# Patient Record
Sex: Male | Born: 1960 | Race: White | Hispanic: No | Smoking: Never smoker
Health system: Southern US, Community
[De-identification: ages and names within clinical notes are randomized; demographics above are authoritative.]

## PROBLEM LIST (undated history)

## (undated) DIAGNOSIS — C801 Malignant (primary) neoplasm, unspecified: Secondary | ICD-10-CM

## (undated) DIAGNOSIS — I10 Essential (primary) hypertension: Secondary | ICD-10-CM

## (undated) HISTORY — PX: KNEE ARTHROSCOPY: SUR90

## (undated) HISTORY — PX: BACK SURGERY: SHX140

## (undated) HISTORY — PX: COLONOSCOPY: SHX174

## (undated) HISTORY — PX: OTHER SURGICAL HISTORY: SHX169

## (undated) HISTORY — PX: ANKLE SURGERY: SHX546

---

## 1989-12-22 DIAGNOSIS — C801 Malignant (primary) neoplasm, unspecified: Secondary | ICD-10-CM

## 1989-12-22 HISTORY — DX: Malignant (primary) neoplasm, unspecified: C80.1

## 1998-11-22 ENCOUNTER — Encounter: Payer: Self-pay | Admitting: Neurosurgery

## 1998-11-22 ENCOUNTER — Ambulatory Visit (HOSPITAL_COMMUNITY): Admission: RE | Admit: 1998-11-22 | Discharge: 1998-11-22 | Payer: Self-pay | Admitting: Neurosurgery

## 1998-12-26 ENCOUNTER — Encounter: Payer: Self-pay | Admitting: Neurosurgery

## 1998-12-28 ENCOUNTER — Inpatient Hospital Stay (HOSPITAL_COMMUNITY): Admission: RE | Admit: 1998-12-28 | Discharge: 1998-12-30 | Payer: Self-pay | Admitting: Neurosurgery

## 1998-12-28 ENCOUNTER — Encounter: Payer: Self-pay | Admitting: Neurosurgery

## 1999-07-01 ENCOUNTER — Encounter: Admission: RE | Admit: 1999-07-01 | Discharge: 1999-07-22 | Payer: Self-pay | Admitting: Internal Medicine

## 1999-10-09 ENCOUNTER — Ambulatory Visit (HOSPITAL_COMMUNITY): Admission: RE | Admit: 1999-10-09 | Discharge: 1999-10-09 | Payer: Self-pay | Admitting: Neurosurgery

## 1999-10-09 ENCOUNTER — Encounter: Payer: Self-pay | Admitting: Neurosurgery

## 2001-05-05 ENCOUNTER — Ambulatory Visit (HOSPITAL_COMMUNITY): Admission: RE | Admit: 2001-05-05 | Discharge: 2001-05-05 | Payer: Self-pay | Admitting: Neurosurgery

## 2001-05-05 ENCOUNTER — Encounter: Payer: Self-pay | Admitting: Neurosurgery

## 2001-08-09 ENCOUNTER — Ambulatory Visit (HOSPITAL_COMMUNITY): Admission: RE | Admit: 2001-08-09 | Discharge: 2001-08-09 | Payer: Self-pay | Admitting: Internal Medicine

## 2002-06-28 ENCOUNTER — Inpatient Hospital Stay (HOSPITAL_COMMUNITY): Admission: RE | Admit: 2002-06-28 | Discharge: 2002-06-30 | Payer: Self-pay | Admitting: Neurosurgery

## 2002-06-28 ENCOUNTER — Encounter: Payer: Self-pay | Admitting: Neurosurgery

## 2002-07-05 ENCOUNTER — Observation Stay (HOSPITAL_COMMUNITY): Admission: RE | Admit: 2002-07-05 | Discharge: 2002-07-06 | Payer: Self-pay | Admitting: Neurosurgery

## 2002-08-19 ENCOUNTER — Ambulatory Visit (HOSPITAL_COMMUNITY): Admission: RE | Admit: 2002-08-19 | Discharge: 2002-08-19 | Payer: Self-pay | Admitting: Neurosurgery

## 2002-08-31 ENCOUNTER — Inpatient Hospital Stay (HOSPITAL_COMMUNITY): Admission: RE | Admit: 2002-08-31 | Discharge: 2002-09-03 | Payer: Self-pay | Admitting: Neurosurgery

## 2002-08-31 ENCOUNTER — Encounter: Payer: Self-pay | Admitting: Neurosurgery

## 2003-12-28 ENCOUNTER — Ambulatory Visit (HOSPITAL_COMMUNITY): Admission: AD | Admit: 2003-12-28 | Discharge: 2003-12-30 | Payer: Self-pay | Admitting: Neurosurgery

## 2006-01-13 ENCOUNTER — Ambulatory Visit (HOSPITAL_COMMUNITY): Admission: RE | Admit: 2006-01-13 | Discharge: 2006-01-13 | Payer: Self-pay | Admitting: Neurosurgery

## 2006-04-22 ENCOUNTER — Encounter: Payer: Self-pay | Admitting: Neurosurgery

## 2007-06-09 ENCOUNTER — Ambulatory Visit (HOSPITAL_BASED_OUTPATIENT_CLINIC_OR_DEPARTMENT_OTHER): Admission: RE | Admit: 2007-06-09 | Discharge: 2007-06-10 | Payer: Self-pay | Admitting: Orthopedic Surgery

## 2008-09-21 ENCOUNTER — Emergency Department: Payer: Self-pay | Admitting: Emergency Medicine

## 2008-09-27 ENCOUNTER — Encounter: Admission: RE | Admit: 2008-09-27 | Discharge: 2008-09-27 | Payer: Self-pay | Admitting: Neurosurgery

## 2008-12-22 HISTORY — PX: ROTATOR CUFF REPAIR: SHX139

## 2010-04-17 ENCOUNTER — Encounter: Admission: RE | Admit: 2010-04-17 | Discharge: 2010-04-17 | Payer: Self-pay | Admitting: Neurosurgery

## 2011-05-06 NOTE — Op Note (Signed)
Maurice Dennis, Maurice Dennis               ACCOUNT NO.:  [id number]   MEDICAL RECORD NO.:  [id number]          PATIENT TYPE:  AMB   LOCATION:  DSC                          FACILITY:  [facility]   PHYSICIAN:  Loreta Ave, M.D. DATE OF BIRTH:  11/20/1961   DATE OF PROCEDURE:  06/09/2007  DATE OF DISCHARGE:                               OPERATIVE REPORT   PREOPERATIVE DIAGNOSIS:  Right shoulder impingement, rotator cuff tear,  biceps tendinitis, acromioclavicular arthritis.   POSTOPERATIVE DIAGNOSIS:  Right shoulder impingement, rotator cuff tear,  biceps tendinitis, acromioclavicular arthritis, with full thickness cuff  tear as well as posterior, superior, and anterior labral tears,  irreparable.   OPERATIVE PROCEDURE:  Right shoulder exam under anesthesia, arthroscopy,  debridement of rotator cuff and labrum, acromioplasty, coracoacromial CA  ligament release, excision of distal clavicle, mini-open repair of his  cuff tear with FiberWire suture and Concept repair system.   SURGEON:  Loreta Ave, M.D.   ASSISTANT:  Genene Churn. Barry Dienes, P.A.-C., present throughout the entire case.   ANESTHESIA:  General.   BLOOD LOSS:  Minimal.   SPECIMENS:  None.   COMPLICATIONS:  None.   DRESSINGS:  Soft compressive with shoulder immobilizer.   PROCEDURE:  The patient was brought to the operating room and placed on  the operating table in a supine position.  After adequate anesthesia was  obtained, the right shoulder was examined.  Full motion and good  stability.  Placed in a beach chair position in a shoulder positioner,  prepped and draped in the usual sterile fashion.  Three portals,  anterior, posterior, lateral.  Shoulder entered with a blunt obturator.  Arthroscope was introduced, shoulder distended and inspected.  Complex  tearing labrum the entire top half all debrided.  Biceps tendon biceps  anchor still intact functional after debridement.  Articular cartilage  looked good throughout.   Full thickness tear rotator cuff supraspinatus  tendon and most of crescent region.  Biceps tendon had some changes but  was structurally intact.  Cuff debrided.  Cannula redirected  subacromially.  Impingement from grade 4 changes and osteolysis of the  AC joint.  Also type 2 acromion.  Bursa resected. Cuff debrided.  CA  ligament released.  Acromioplasty to a type 1 acromion with a shaver and  high speed bur.  Distal clavicle exposed.  CA ligament had already been  released with cautery.  Lateral cm of clavicle and periarticular spurs  removed with shaver and high speed bur.  Adequacy of decompression and  clavicle excision confirmed viewing from all portals.  Instruments and  fluid removed.  Deltoid splitting incision through the lateral portal.  Subacromial space accessed.  Adequacy of decompression confirmed.  Cuff  was then mobilized and debrided back to healthy tissue and captured with  weave FiberWire suture.  Tuberosity roughened to bleeding bone.  Concept  repair system used to channel the sutures through numerous drill holes.  Arm was then abducted, sutures tied over a bony bridge.  This yielded a  nice firm watertight closure of the cuff with full passive motion.  Wound irrigated.  Deltoid  closed with Vicryl.  Skin and subcutaneous  tissue with Vicryl.  Portals closed with nylon.  Margins of the wound  and subacromial space all injected with Marcaine.  Sterile compressive  dressing applied.  Shoulder immobilizer applied.  Anesthesia reversed.  Brought to the recovery room.  Tolerated surgery well.  No  complications.      Loreta Ave, M.D.  Electronically Signed     DFM/MEDQ  D:  06/09/2007  T:  06/09/2007  Job:  [id number]

## 2011-05-09 NOTE — H&P (Signed)
NAME:  Maurice Dennis, Maurice Dennis                         ACCOUNT NO.:  [id number]   MEDICAL RECORD NO.:  [id number]                   PATIENT TYPE:  OIB   LOCATION:  2899                                 FACILITY:  [facility]   PHYSICIAN:  Payton Doughty, M.D.                   DATE OF BIRTH:  1961-12-13   DATE OF ADMISSION:  12/28/2003  DATE OF DISCHARGE:                                HISTORY & PHYSICAL   ADMISSION DIAGNOSIS:  Malposition of spinal cord stimulator.   HISTORY OF PRESENT ILLNESS:  This is a 50 year old right-handed white  gentleman with numerous spinal procedures.  Had a spinal cord stimulator  placed in July, 2003.  He has had revision of it.  It still does not seem to  be working right.  After careful inspection and discussion with numerous  people, it seems that the electrodes may be placed opposite the direction  they need to be facing for stimulation of the spinal cord.  He is now  admitted for revision.  He also has the receiver on his left hip, and he  wants it moved to this right hip.   PAST MEDICAL HISTORY:  1. Hypertension.  2. Basal cell carcinoma.  3. He had an appendectomy at 50 years of age.   MEDICATIONS:  1. Vicodin.  2. OxyContin.  3. Flexeril.  4. Neurontin.   ALLERGIES:  None.   SOCIAL HISTORY:  He does not smoke or drink.  He is on disability.   FAMILY HISTORY:  Mom died at 9 of lung cancer.  Dad died of unknown causes.   REVIEW OF SYSTEMS:  Noncontributory.  His bladder is functioning.  He does  have back and leg pain.   PHYSICAL EXAMINATION:  HEENT:  Normal.  He does have good range of motion of  the neck.  CHEST:  Clear.  CARDIAC:  Regular rate and rhythm.  ABDOMEN:  Nontender.  No hepatosplenomegaly.  EXTREMITIES:  Without clubbing or cyanosis.  Peripheral pulses are good.  GU:  Deferred.  NEUROLOGIC:  He is intact in regard to strength, sensation, and reflexes in  the upper and lower extremities.  Cranial nerves are functioning normally.  He  has a well-healed incision on his back.   CLINICAL IMPRESSION:  A probable malposition of spinal cord stimulator.  The  plan is for admission and revision.  The risks and benefits of this approach  have been discussed with him, and he wishes to proceed.                                                Payton Doughty, M.D.    MWR/MEDQ  D:  12/28/2003  T:  12/28/2003  Job:  [id number]

## 2011-05-09 NOTE — Op Note (Signed)
[place]. [facility]  Patient:    [name], [name] Visit Number: [id number] MRN: [id number]          Service Type: SUR Location: 3000 3011 [id number] Attending Physician:  Emeterio Reeve Dictated by:   Payton Doughty, M.D. Proc. Date: 06/28/02 Admit Date:  06/28/2002 Discharge Date: 06/30/2002                             Operative Report  PREOPERATIVE DIAGNOSIS:  Chronic intractable pain.  POSTOPERATIVE DIAGNOSIS:  Chronic intractable pain.  OPERATION PERFORMED:  T10 thoracic laminectomy for insertion of epidural spinal cord stimulating electrode array.  SURGEON:  Payton Doughty, M.D.  ANESTHESIA:  General endotracheal.  PREP:  Sterile Betadine prep and scrub with alcohol wipe.  COMPLICATIONS:  None.  ASSISTANT:  [place].  DESCRIPTION OF PROCEDURE:  The patient is a 50 year old gentleman with chronic intractable back and right leg pain.  The patient was taken to the operating room, smoothly anesthetized and intubated, and placed prone on the operating table.  Following shave, prep and drape in the usual sterile fashion the skin was infiltrated with 1% lidocaine, 1:400,000 epinephrine.  Skin was incised from the bottom of T11 to the bottom of T9.  The lamina of T10 and T11 were exposed bilaterally subperiosteal plane and a partial laminectomy at T10 was carried out to allow access to the posterior epidural space.  Ligamentum flavum was removed and the epidural space visualized.  The model [id number] electrode was placed in the epidural space without difficulty.  It was connected to the extension leads which were tunneled subcutaneously and exited on the right side over about the 12th rib.  The sleeves had been previously placed over the leads and they were anchored to the T11 lamina through a hole that was created with a towel clip.  It was anchored there with 3-0 silk.  The wound was irrigated and hemostasis assured.  The fascia was reapproximated with 0  Vicryl in interrupted fashion.  Subcutaneous tissues were reapproximated with 0 Vicryl in interrupted fashion.  Subcuticular tissues were reapproximated with 3-0 Vicryl in interrupted fashion.  The skin was closed with 3-0 nylon in a running locked fashion.  Bacitracin Telfa dressing was applied over the exit site for the leads.  An occlusive dressing was placed with bacitracin.  The patient then returned to the recovery room in good condition.Dictated by:   Payton Doughty, M.D. Attending Physician:  Emeterio Reeve DD:  06/28/02 TD:  07/01/02 Job: [id number]

## 2011-05-09 NOTE — Discharge Summary (Signed)
   NAME:  ANGELICA, WIX NO.:  [id number]   MEDICAL RECORD NO.:  [id number]                   PATIENT TYPE:  INP   LOCATION:                                       FACILITY:  [facility]   PHYSICIAN:  Payton Doughty, M.D.                   DATE OF BIRTH:  1961-01-09   DATE OF ADMISSION:  08/31/2002  DATE OF DISCHARGE:  09/03/2002                                 DISCHARGE SUMMARY   PROCEDURE:  Replacement of spinal cord stimulator.   COMPLICATIONS:  None.   DISCHARGE STATUS:  Doing well.   HOSPITAL COURSE:  This is a 50 year old gentleman who had a spinal cord  stimulator placed several weeks before. It worked for a while and then  stopped working, there for readjustment. He was admitted after ascertaining  normal laboratory values, taken to the operating room, and had the spinal  cord stimulator replaced and moved up. Postoperatively, he did well with  externalized leads. He was discharged home the next morning on ciprofloxacin  and pain medications. His followup will be in the [facility] office in a week for reimplantation of his receiver.                                               Payton Doughty, M.D.    MWR/MEDQ  D:  06/21/2003  T:  06/21/2003  Job:  [id number]

## 2011-05-09 NOTE — Op Note (Signed)
   NAME:  Maurice Dennis, PELCHER                         ACCOUNT NO.:  [id number]   MEDICAL RECORD NO.:  [id number]                   PATIENT TYPE:  INP   LOCATION:  NA                                   FACILITY:  [facility]   PHYSICIAN:  Payton Doughty, M.D.                   DATE OF BIRTH:  1961-04-18   DATE OF PROCEDURE:  08/19/2002  DATE OF DISCHARGE:                                 OPERATIVE REPORT   PREOPERATIVE DIAGNOSIS:  Malfunctioning spinal cord stimulator.   POSTOPERATIVE DIAGNOSIS:  Malfunctioning spinal cord stimulator.   OPERATION:  Exploration of spinal cord stimulator receiver.   SURGEON:  Payton Doughty, M.D.   COMPLICATIONS:  None.   ANESTHESIA:  Sedation and 1% local lidocaine.   ASSISTANT:  None.   PROCEDURE:  This is a 50 year old gentleman with spinal cord stimulator in  place.  It is not working appropriately.  Concern was over the receiver.  He  was taken to the operating room, placed in the right side down, left side up  position.  Sedation was given following shave, prep, and drape in the usual  sterile fashion.  Field block was done around the stimulator receiver site.  The site was opened and stimulator externalized.  The wire was removed and  by a sterile inner wire was connected to a programmed stimulator.  Electrical contact was confirmed throughout the system.  Physiologic  response occurred only on one electrode on the left side.  Consultation with  the engineer confirmed that the source of impedance was probably  physiologic.  The receiver was therefore reconnected and replaced.  The  incision was closed.  The patient returned to the recovery room in good  condition.                                               Payton Doughty, M.D.    MWR/MEDQ  D:  08/19/2002  T:  08/23/2002  Job:  [id number]

## 2011-05-09 NOTE — Op Note (Signed)
[place]. [facility]  Patient:    Maurice Dennis, Maurice Dennis Visit Number: [id number] MRN: [id number]          Service Type: DSU Location: 3000 3007 01 Attending Physician:  Emeterio Reeve Dictated by:   Payton Doughty, M.D. Proc. Date: 07/05/02 Admit Date:  07/05/2002 Discharge Date: 07/06/2002                             Operative Report  PREOPERATIVE DIAGNOSIS:  Intractable back pain, status post implantation of an epidural spinal cord stimulator.  POSTOPERATIVE DIAGNOSIS:  Intractable back pain, status post implantation of an epidural spinal cord stimulator.  PROCEDURE:  Connection of epidural stimulator to antenna source.  SURGEON:  Payton Doughty, M.D.  ANESTHESIA:  General endotracheal.  PREP:  Sterile prep with alcohol wipe.  COMPLICATIONS:  None.  ASSISTANT:  [place].  INDICATIONS FOR PROCEDURE:  This is a 50 year old gentleman who has had epidural leads implanted previously.  DESCRIPTION OF PROCEDURE:  He is taken to the operating room, intubated, placed prone on the operating room table.  Following shave, prep, and drape in the usual sterile fashion, the old skin incision was re-opened and the externalized leads identified, disconnected from the permanent leads, and removed from the skin on the right side.  A subcutaneous tunnel was created down to the left side to just below the posterior cephalic spine, and a subcutaneous tunnel created there which included only the skin.  It was less than 1 cm thick.  The antenna was tunneled from the old incision to the new incision.  The leads brought through the tunnel and connected to the antenna, secured there, and the strain relief garment was placed over top of it.  A loop was taken, and it was implanted with the loop underneath the antenna. Both wounds were copiously irrigated, hemostasis assured, and closed with successive layers of 3-0 Vicryl, and the skin with 3-0 nylon in a simple fashion.   Bacitracin and Telfa dressing was applied, and made occlusive with Op-Site.  The patient will return to the recovery room in good condition.Dictated by:   Payton Doughty, M.D. Attending Physician:  Emeterio Reeve DD:  07/05/02 TD:  07/08/02 Job: [id number]

## 2011-05-09 NOTE — Op Note (Signed)
   NAME:  Maurice Dennis, Maurice Dennis                         ACCOUNT NO.:  [id number]   MEDICAL RECORD NO.:  [id number]                   PATIENT TYPE:  INP   LOCATION:  3009                                 FACILITY:  [facility]   PHYSICIAN:  Payton Doughty, M.D.                   DATE OF BIRTH:  28-Feb-1961   DATE OF PROCEDURE:  09/02/2002  DATE OF DISCHARGE:                                 OPERATIVE REPORT   PREOPERATIVE DIAGNOSIS:  Externalized spinal cord stimulator.   POSTOPERATIVE DIAGNOSIS:  Externalized spinal cord stimulator.   PROCEDURE:  Internalization of spinal cord stimulator with connection to  subcutaneous receiver.   SURGEON:  Payton Doughty, M.D.   NURSE ASSISTANT:  [facility].   ANESTHESIA:  General endotracheal.   PREPARATION:  Sterile Betadine prep and scrub with alcohol wipe.   COMPLICATIONS:  None.   DESCRIPTION OF PROCEDURE:  This is a 50 year old right-handed white  gentleman with an externalized wire from his spinal cord stimulator.  He was  taken to the operating room and smoothly anesthetized and intubated, placed  prone upon the operating room table.  Following shave, prep, and drape in  the usual sterile fashion, his two old incisions were opened and the spinal  cord leads severed and withdrawn from the skin, the extension leads.  A  subcutaneous tunnel was then created to the receiver site.  The receiver was  exposed and the old leads removed.  The new leads were tunneled  subcutaneously to the receiver site, connected to the receivers with the  nipples in place.  Both wounds were copiously irrigated with bacitracin  solution.  The system was thus connected.  The receiver was placed back in  the receiver pocket.  All incisions were closed with successive layers of 0  Vicryl, 3-0 Vicryl, and 3-0 nylon in the skin.  A Bacitracin Telfa dressing  was applied, made occlusive with OpSite, and the patient returned to the  recovery room in good condition.                                 Payton Doughty, M.D.    MWR/MEDQ  D:  09/02/2002  T:  09/03/2002  Job:  [id number]

## 2011-05-09 NOTE — H&P (Signed)
[place]. [facility]  Patient:    Maurice Dennis, Maurice Dennis Visit Number: [id number] MRN: [id number]          Service Type: SUR Location: 3000 3011 01 Attending Physician:  Emeterio Reeve Dictated by:   Payton Doughty, M.D. Admit Date:  06/28/2002 Discharge Date: 06/30/2002                           History and Physical  ADMITTING DIAGNOSES:  Intractable back and right leg pain.  BRIEF HISTORY:  This is a 50 year old right handed white gentleman who has had decompressive laminectomies in 1997, lumbar fusion in 1997.  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  He has been visited with the pain clinic who feels that he is a good candidate for spinal cord stimulator and he is for placement.  PAST MEDICAL HISTORY:  Remarkable for hypertension and a basal cell carcinoma in 1984 and an appendectomy at 50 years of age.  MEDICATIONS: 1. Vicodin. 2. OxyContin. 3. Flexeril.  ALLERGIES:  None.  SOCIAL HISTORY:  He does not smoke or drink.  He is on disability.  FAMILY HISTORY:  Mom died at 68 of lung cancer.  Father died of unknown causes.  REVIEW OF SYSTEMS:  noncontributory for bladder dysfunction.  He does have back and leg pain.  PHYSICAL EXAMINATION:  HEENT:  Within normal limits.  NECK:  Good range of motion in the neck.  CHEST:  Clear.  CARDIAC:  Regular rate and rhythm.  ABDOMEN:  Nontender.  No hepatosplenomegaly.  EXTREMITIES:  Without clubbing or cyanosis.  GU:  Deferred.  PERIPHERAL PULSES:  Good.  NEUROLOGIC:  He is awake, alert and oriented.  His cranial nerves are intact.  MOTOR EXAMINATION: shows 5/5 strength throughout the upper and lower extremities with bilaterally positive straight leg raises.  Reflexes are 2 at the knees, absent at the ankles.  Toes are downgoing bilaterally.  As noted before he visited with the pain  clinic who thought he was a good candidate but not for percutaneous procedure.  He is therefore admitted for placement of a percutaneous paddle array from T10 to T8 for trialing with subsequent reimplantation ______ trial successful.  The risks and benefits of this approach have been discussed with him and he wished to proceed. Dictated by:   Payton Doughty, M.D. Attending Physician:  Emeterio Reeve DD:  06/28/02 TD:  06/30/02 Job: [id number]

## 2011-05-09 NOTE — Op Note (Signed)
Maurice Dennis, Maurice Dennis               ACCOUNT NO.:  [id number]   MEDICAL RECORD NO.:  [id number]          PATIENT TYPE:  AMB   LOCATION:  SDS                          FACILITY:  [facility]   PHYSICIAN:  Payton Doughty, M.D.      DATE OF BIRTH:  1961-11-19   DATE OF PROCEDURE:  01/13/2006  DATE OF DISCHARGE:  01/13/2006                                 OPERATIVE REPORT   PREOPERATIVE DIAGNOSIS:  Spinal cord stimulator with receiver in place.   POSTOPERATIVE DIAGNOSIS:  Spinal cord stimulator with receiver in place.   OPERATIVE PROCEDURE:  Changing receiver for rechargeable battery powered  pulse generator.   SURGEON:  Payton Doughty, M.D.   SERVICE:  Neurosurgery   ANESTHESIA:  General endotracheal anesthesia.   PREPARATION:  Betadine and alcohol wipe.   COMPLICATIONS:  None.   ASSISTANT:  [place]   BODY OF TEXT:  This is a 50 year old gentleman with spinal stimulator in  place, has a receiver and wanted to change it to a battery.  He is taken to  the operating room, smoothly anesthetized, intubated, placed prone on the  operating table.  Following shave, prep, and drape in the usual sterile  fashion, the old skin incision was opened over the receiver.  The receiver  was rapidly mobilized, wires detached and tested, the impedance was good.  The pocket was enlarged appropriately and the battery pack placed within it  after it had been connected to the wires.  Impedance testing was, once  again, satisfactory.  The wound was irrigated and hemostasis assured, and  the incision closed with successive layers of 0 Vicryl, 3-0 Vicryl, and 3-0  nylon.  Betadine and Telfa dressing was applied and made occlusive with  OpSite and the patient returned to the recovery room in good condition.           ______________________________  Payton Doughty, M.D.     MWR/MEDQ  D:  01/13/2006  T:  01/13/2006  Job:  [id number]

## 2011-05-09 NOTE — Op Note (Signed)
NAME:  Maurice Dennis, Maurice Dennis                         ACCOUNT NO.:  [id number]   MEDICAL RECORD NO.:  [id number]                   PATIENT TYPE:  INP   LOCATION:  3009                                 FACILITY:  [facility]   PHYSICIAN:  Payton Doughty, M.D.                   DATE OF BIRTH:  Feb 06, 1961   DATE OF PROCEDURE:  08/31/2002  DATE OF DISCHARGE:                                 OPERATIVE REPORT   PREOPERATIVE DIAGNOSIS:  Nonfunctioning spinal cord stimulator.   POSTOPERATIVE DIAGNOSIS:  Nonfunctioning spinal cord stimulator.   PROCEDURE:  Revision of spinal cord stimulator.   SURGEON:  Payton Doughty, M.D.   NURSE ASSISTANT:  [facility].   ANESTHESIA:  General endotracheal.   PREPARATION:  Sterile Betadine prep and scrub with alcohol wipe.   COMPLICATIONS:  None.   DESCRIPTION OF PROCEDURE:  This is a 50 year old gentleman with spinal cord  stimulator that was extending from T9 to T7, and it is not functioning.  Here for removing the leads.  The patient was taken to the operating room  and smoothly anesthetized and intubated, placed prone on the operating  table.  Following shave, prep, and drape in the usual sterile fashion, the  old skin incision in the thoracic spine was reopened.  Careful dissection  through the scar revealed the lamina of T10 and following the wires down  into the epidural space, the old electrodes were uncovered.  The right-sided  wire was noted to have loss of its insulated covering.  This cannot be said  whether this was present prior to operation or was caused intraoperatively.  The old electrode was dissected free and removed.  The bottom end of that  was at about the T9 space.  It was desirable to place the new electrode from  T9 through T11, so the partial laminectomy at T9 and removal of some scar as  well as dissection of the posterior epidural space was undertaken under T10.  The electrode was then slid under the lamina of T10, went smoothly without  difficulty.  Initial x-ray showed the electrode going from mid-T10 to mid-  T12.  Further laminotomy of T9 was undertaken and the electrode pulled back  so that it covered the area from the bottom of T9 to the bottom of T11.  It  was then attached to the extension wires, which exited on the right side  approximately 10 cm lateral to the skin incision.  The wound was irrigated  and hemostasis assured.  The electrodes have been secured to the lamina of  T9 by an 0 silk stitch.  The fascia was reapproximated with 0 Vicryl in  interrupted fashion, the subcutaneous tissue was reapproximated with 0  Vicryl in interrupted fashion, the subcuticular tissue was reapproximated  with 3-0 Vicryl in interrupted fashion.  The skin was closed with 3-0 nylon  in a running locked fashion.  Bacitracin dressing was applied over the  incision and over the wire exit site.  The patient then returned to the  recovery room in good condition.                                               Payton Doughty, M.D.   MWR/MEDQ  D:  08/31/2002  T:  08/31/2002  Job:  [id number]

## 2011-05-09 NOTE — H&P (Signed)
NAME:  Maurice Dennis, Maurice Dennis                         ACCOUNT NO.:  [id number]   MEDICAL RECORD NO.:  [id number]                   PATIENT TYPE:  INP   LOCATION:  3009                                 FACILITY:  [facility]   PHYSICIAN:  Payton Doughty, M.D.                   DATE OF BIRTH:  05-26-1961   DATE OF ADMISSION:  08/31/2002  DATE OF DISCHARGE:                                HISTORY & PHYSICAL   ADMITTING DIAGNOSIS:  Nonfunctioning spinal cord stimulator.   BODY OF TEXT:  This is a 50 year old, right-handed, white gentleman with an  extremely complicated back history.  He has had decompressive laminectomy in  1997, lumbar fusion in 1997, epidural __________  resection L3 and L4.  He  underwent implantation of spinal cord stimulator 06/28/2002.  The stimulator  bridged from T9 to T7.  Initially it worked well during trials.  After  implantation it has stopped working in the side where his leg is the worse,  which is the right side.  He has undergone exploration stimulator and  receiver is found to be working all right.  After consultation with doctors  in [place] and with the company, it is felt that the stimulator panels need  to be moved to a lower level.  He is now admitted for this revision.   PAST MEDICAL HISTORY:  Marked for hypertension, basil cell carcinoma in  1984, appendectomy 15 years ago.   MEDICATIONS:  Vicodin, OxyContin, and Flexeril.   ALLERGIES:  NONE.   SOCIAL HISTORY:  He does not smoke or drink.  He is on disability.   FAMILY HISTORY:  Mother died at 34 of lung cancer.  Father died of unknown  causes.   REVIEW OF SYSTEMS:  Noncontributory for bladder dysfunction.  He has back  and right leg pain.   PHYSICAL EXAMINATION:  HEENT:  Normal.  NECK:  He has good range of motion.  CHEST:  Normal.  CARDIOVASCULAR:  Regular rate and rhythm.  ABDOMEN:  Nontender with no hepatosplenomegaly.  EXTREMITIES:  No clubbing, cyanosis, or edema.  Peripheral pulses are good.  NEUROLOGICAL:  He is awake, alert, and oriented.  His cranial nerves are  intact.  His motor exam 5/5 strength throughout the upper and lower  extremities.  There is no current sensory deficit.  His incisions are well-  healed and his receiver site is well-healed.   PLAN:  Admission and revision.  Initially we tried use of same incision and  replaced his electrodes down in a retrograde fashion.  If that is not  feasible, it will have to be externalized and done as an open procedure from  T11 up to T9.  The received itself seems to be in good position.  If we can  do this without removing it, I think that would be the best option for this  gentleman.  Payton Doughty, M.D.   MWR/MEDQ  D:  08/31/2002  T:  08/31/2002  Job:  [id number]

## 2011-05-09 NOTE — H&P (Signed)
[facility]  Patient:    Maurice Dennis, Maurice Dennis Visit Number: [id number] MRN: [id number]          Service Type: DSU Location: 3000 [id number] Attending Physician:  Emeterio Reeve Dictated by:   Payton Doughty, M.D. Admit Date:  07/05/2002 Discharge Date: 07/06/2002                           History and Physical  INTERVAL ADMIT NOTE  HISTORY OF PRESENT ILLNESS:  This is a 50 year old right-handed gentleman who a week ago underwent implantation of an epidural stimulator via thoracic laminectomy.  Postoperatively, he has done well and trialing of the stimulator has been carried out this week and he has obtained significant pain relief and now he is admitted for placement of the internalized antenna.  PAST MEDICAL HISTORY:  Medical history is remarkable for intractable back pain; he has had several laminectomies and fusion.  MEDICATIONS:  He is on OxyContin, Neurontin, Celebrex and Vicodin.  PHYSICAL EXAMINATION:  GENERAL:  His general exam is intact.  NEUROLOGIC:  His neurologic exam is intact.  CHEST:  He has some drainage at the site of his externalized wires, which is anticipated.  PLAN:  The plan is for internalization of his stimulator and the risks and benefits have been discussed with him and he wishes to proceed. Dictated by:   Payton Doughty, M.D. Attending Physician:  Emeterio Reeve DD:  07/05/02 TD:  07/07/02 Job: [id number]

## 2011-05-09 NOTE — Op Note (Signed)
NAME:  Maurice Dennis, Maurice Dennis                         ACCOUNT NO.:  [id number]   MEDICAL RECORD NO.:  [id number]                   PATIENT TYPE:  OIB   LOCATION:  2899                                 FACILITY:  [facility]   PHYSICIAN:  Payton Doughty, M.D.                   DATE OF BIRTH:  July 11, 1961   DATE OF PROCEDURE:  12/28/2003  DATE OF DISCHARGE:                                 OPERATIVE REPORT   PREOPERATIVE DIAGNOSIS:  Malposition of spinal cord stimulator.   POSTOPERATIVE DIAGNOSIS:  Malposition of spinal cord stimulator.   OPERATION PERFORMED:  Revision of spinal cord stimulator.   SURGEON:  Payton Doughty, M.D.   ASSISTANTBasilia Jumbo.   ANESTHESIA:  General endotracheal.   PREP:  Sterile Betadine prep and scrub with alcohol wipe.   COMPLICATIONS:  None.   INDICATIONS FOR PROCEDURE:  This is a 50 year old gentleman with a spinal  cord stimulator placed that is probably in the wrong position.   DESCRIPTION OF PROCEDURE:  The patient was taken to the operating room,  smoothly anesthetized, intubated, and placed prone on the operating table.  Following shave, prep and drape in the usual sterile fashion, the skin  incision was opened to the T8-T9 level and the stimulator wires identified.  Dissection along the wires revealed the insertion point of the spinal cord  stimulating electrode which was removed and was found indeed to be  positioned such that the stimulating part of the electrode was away from the  cord.  It was replaced with a curved stimulating electrode in which they  reported it could not turn over and had to be inserted in the proper  direction.  Attention was turned to the left hip where the old receiver was  isolated by a skin incision and removed per the patient's request.  That  incision was closed.  Incision was then made on the right hip in mirror  image fashion.  Subcutaneous pocket created.  The stimulating wires were  then tunneled from the stimulator site to the  receiver site and connected to  the receiver.  Strain relief devices were placed and secured at both ends.  The electrode receiver was implanted under the subcutaneous pocket with good  fit.  Both wounds were irrigated and hemostasis assured.  All incisions were  closed in successive layers 2-0 Vicryl interrupted fashion, 3-0 Vicryl  interrupted fashion and 3-0 nylon in a running fashion on the skin.  Betadine Telfa dressing secured all incisions which were made occlusive with  Telfa and OpSite.  The patient then returned to the recovery room in good  condition.                                               Emilie Rutter.  Channing Mutters, M.D.    MWR/MEDQ  D:  12/28/2003  T:  12/29/2003  Job:  [id number]

## 2011-09-08 ENCOUNTER — Other Ambulatory Visit: Payer: Self-pay | Admitting: Neurosurgery

## 2011-09-08 DIAGNOSIS — M47816 Spondylosis without myelopathy or radiculopathy, lumbar region: Secondary | ICD-10-CM

## 2011-09-15 ENCOUNTER — Ambulatory Visit
Admission: RE | Admit: 2011-09-15 | Discharge: 2011-09-15 | Disposition: A | Discharge status: Home / Self Care | Payer: Medicare Other | Source: Ambulatory Visit | Attending: Neurosurgery | Admitting: Neurosurgery

## 2011-09-15 ENCOUNTER — Other Ambulatory Visit: Payer: Self-pay | Admitting: Neurosurgery

## 2011-09-15 DIAGNOSIS — M47816 Spondylosis without myelopathy or radiculopathy, lumbar region: Secondary | ICD-10-CM

## 2011-09-15 HISTORY — DX: Essential (primary) hypertension: I10

## 2011-09-15 HISTORY — DX: Malignant (primary) neoplasm, unspecified: C80.1

## 2011-09-15 MED ORDER — SODIUM CHLORIDE 0.9 % IV SOLN
Freq: Once | INTRAVENOUS | Status: AC
  Administered 2011-09-15: 08:00:00 via INTRAVENOUS

## 2011-09-15 MED ORDER — FENTANYL CITRATE 0.05 MG/ML IJ SOLN
25.0000 ug | INTRAMUSCULAR | Status: DC | PRN
  Administered 2011-09-15: 100 ug via INTRAVENOUS

## 2011-09-15 MED ORDER — IOHEXOL 180 MG/ML  SOLN
3.0000 mL | Freq: Once | INTRAMUSCULAR | Status: AC | PRN
  Administered 2011-09-15: 3 mL

## 2011-09-15 MED ORDER — DEXTROSE 5 % IV SOLN
1.0000 g | Freq: Once | INTRAVENOUS | Status: AC
  Administered 2011-09-15: 1 g via INTRAVENOUS

## 2011-09-15 MED ORDER — KETOROLAC TROMETHAMINE 30 MG/ML IJ SOLN
30.0000 mg | Freq: Once | INTRAMUSCULAR | Status: AC
  Administered 2011-09-15: 30 mg via INTRAVENOUS

## 2011-09-15 MED ORDER — OXYCODONE-ACETAMINOPHEN 5-325 MG PO TABS
2.0000 | ORAL_TABLET | Freq: Once | ORAL | Status: AC
  Administered 2011-09-15: 2 via ORAL

## 2011-09-15 MED ORDER — MIDAZOLAM HCL 2 MG/2ML IJ SOLN
1.0000 mg | INTRAMUSCULAR | Status: DC | PRN
  Administered 2011-09-15 (×2): 1 mg via INTRAVENOUS

## 2011-09-15 MED ORDER — SODIUM CHLORIDE 0.9 % IV SOLN: 4.0000 mg | Freq: Four times a day (QID) | INTRAVENOUS | Status: DC | PRN

## 2011-09-15 NOTE — Progress Notes (Signed)
Pt has reviewed history and is here today for discogram,

## 2011-10-08 LAB — POCT HEMOGLOBIN-HEMACUE: Hemoglobin: 16

## 2011-10-08 LAB — BASIC METABOLIC PANEL
CO2: 31
GFR calc non Af Amer: >60
Glucose, Bld: 190 — ABNORMAL HIGH
Potassium: 3.4 — ABNORMAL LOW
Sodium: 141

## 2013-01-05 ENCOUNTER — Other Ambulatory Visit: Payer: Self-pay | Admitting: Neurosurgery

## 2013-01-05 DIAGNOSIS — M4306 Spondylolysis, lumbar region: Secondary | ICD-10-CM

## 2013-01-10 ENCOUNTER — Ambulatory Visit
Admission: RE | Admit: 2013-01-10 | Discharge: 2013-01-10 | Disposition: A | Discharge status: Home / Self Care | Payer: Medicare Other | Source: Ambulatory Visit | Attending: Neurosurgery | Admitting: Neurosurgery

## 2013-01-10 DIAGNOSIS — M4306 Spondylolysis, lumbar region: Secondary | ICD-10-CM

## 2013-02-14 ENCOUNTER — Other Ambulatory Visit: Payer: Self-pay | Admitting: Neurosurgery

## 2013-02-25 ENCOUNTER — Other Ambulatory Visit: Payer: Medicare Other

## 2013-03-10 ENCOUNTER — Other Ambulatory Visit: Payer: Medicare Other

## 2013-04-07 ENCOUNTER — Ambulatory Visit
Admission: RE | Admit: 2013-04-07 | Discharge: 2013-04-07 | Disposition: A | Discharge status: Home / Self Care | Payer: Medicare Other | Source: Ambulatory Visit | Attending: Neurosurgery | Admitting: Neurosurgery

## 2013-04-07 DIAGNOSIS — M47816 Spondylosis without myelopathy or radiculopathy, lumbar region: Secondary | ICD-10-CM

## 2013-04-07 MED ORDER — IOHEXOL 180 MG/ML  SOLN: 1.0000 mL | Freq: Once | INTRAMUSCULAR | Status: AC | PRN

## 2013-04-07 MED ORDER — METHYLPREDNISOLONE ACETATE 40 MG/ML INJ SUSP (RADIOLOG: 120.0000 mg | Freq: Once | INTRAMUSCULAR | Status: DC

## 2013-06-08 ENCOUNTER — Other Ambulatory Visit: Payer: Self-pay | Admitting: Neurosurgery

## 2013-06-08 DIAGNOSIS — M47816 Spondylosis without myelopathy or radiculopathy, lumbar region: Secondary | ICD-10-CM

## 2013-07-07 ENCOUNTER — Other Ambulatory Visit: Payer: Self-pay | Admitting: Neurosurgery

## 2013-07-07 ENCOUNTER — Ambulatory Visit
Admission: RE | Admit: 2013-07-07 | Discharge: 2013-07-07 | Disposition: A | Discharge status: Home / Self Care | Payer: Medicare Other | Source: Ambulatory Visit | Attending: Neurosurgery | Admitting: Neurosurgery

## 2013-07-07 VITALS — BP 170/97 | HR 55

## 2013-07-07 DIAGNOSIS — M47816 Spondylosis without myelopathy or radiculopathy, lumbar region: Secondary | ICD-10-CM

## 2013-07-07 MED ORDER — METHYLPREDNISOLONE ACETATE 40 MG/ML INJ SUSP (RADIOLOG
120.0000 mg | Freq: Once | INTRAMUSCULAR | Status: AC
  Administered 2013-07-07: 120 mg via EPIDURAL

## 2013-07-07 MED ORDER — IOHEXOL 180 MG/ML  SOLN
1.0000 mL | Freq: Once | INTRAMUSCULAR | Status: AC | PRN
  Administered 2013-07-07: 1 mL via INTRAVENOUS

## 2013-07-07 MED ORDER — HYDROMORPHONE HCL PF 2 MG/ML IJ SOLN: 2.0000 mg | Freq: Once | INTRAMUSCULAR | Status: DC

## 2013-07-07 MED ORDER — ONDANSETRON HCL 4 MG/2ML IJ SOLN: 4.0000 mg | Freq: Once | INTRAMUSCULAR | Status: DC

## 2013-11-01 ENCOUNTER — Ambulatory Visit: Payer: Self-pay | Admitting: Gastroenterology

## 2014-11-21 ENCOUNTER — Other Ambulatory Visit: Payer: Self-pay | Admitting: Neurosurgery

## 2014-11-21 DIAGNOSIS — M47816 Spondylosis without myelopathy or radiculopathy, lumbar region: Secondary | ICD-10-CM

## 2014-12-04 ENCOUNTER — Ambulatory Visit
Admission: RE | Admit: 2014-12-04 | Discharge: 2014-12-04 | Disposition: A | Discharge status: Home / Self Care | Payer: Medicare Other | Source: Ambulatory Visit | Attending: Neurosurgery | Admitting: Neurosurgery

## 2014-12-04 DIAGNOSIS — M47816 Spondylosis without myelopathy or radiculopathy, lumbar region: Secondary | ICD-10-CM

## 2015-04-11 ENCOUNTER — Ambulatory Visit
Admit: 2015-04-11 | Disposition: A | Discharge status: Home / Self Care | Payer: Self-pay | Attending: Internal Medicine | Admitting: Internal Medicine

## 2016-06-03 ENCOUNTER — Other Ambulatory Visit: Payer: Self-pay | Admitting: Neurosurgery

## 2016-06-03 DIAGNOSIS — M47816 Spondylosis without myelopathy or radiculopathy, lumbar region: Secondary | ICD-10-CM

## 2016-06-13 ENCOUNTER — Ambulatory Visit
Admission: RE | Admit: 2016-06-13 | Discharge: 2016-06-13 | Disposition: A | Discharge status: Home / Self Care | Payer: Self-pay | Source: Ambulatory Visit | Attending: Neurosurgery | Admitting: Neurosurgery

## 2016-06-13 DIAGNOSIS — M47816 Spondylosis without myelopathy or radiculopathy, lumbar region: Secondary | ICD-10-CM

## 2016-12-19 ENCOUNTER — Other Ambulatory Visit: Payer: Self-pay | Admitting: Neurosurgery

## 2016-12-19 DIAGNOSIS — M461 Sacroiliitis, not elsewhere classified: Secondary | ICD-10-CM

## 2017-01-15 ENCOUNTER — Inpatient Hospital Stay: Admission: RE | Admit: 2017-01-15 | Payer: Medicare Other | Source: Ambulatory Visit

## 2017-01-23 ENCOUNTER — Ambulatory Visit
Admission: RE | Admit: 2017-01-23 | Discharge: 2017-01-23 | Disposition: A | Discharge status: Home / Self Care | Payer: Medicare Other | Source: Ambulatory Visit | Attending: Neurosurgery | Admitting: Neurosurgery

## 2017-01-23 DIAGNOSIS — M461 Sacroiliitis, not elsewhere classified: Secondary | ICD-10-CM

## 2017-01-23 MED ORDER — METHYLPREDNISOLONE ACETATE 40 MG/ML INJ SUSP (RADIOLOG
120.0000 mg | Freq: Once | INTRAMUSCULAR | Status: AC
  Administered 2017-01-23: 120 mg via INTRA_ARTICULAR

## 2017-01-23 MED ORDER — IOPAMIDOL (ISOVUE-M 200) INJECTION 41%
1.0000 mL | Freq: Once | INTRAMUSCULAR | Status: AC
  Administered 2017-01-23: 1 mL via INTRA_ARTICULAR

## 2017-01-23 NOTE — Discharge Instructions (Addendum)
Joint Injection Discharge Instructions  1. After your joint injection, use ice to the affected area for the next 24 hours as a temporary increase in pain is not uncommon for a day or two after your procedure.  2. Resume all medications unless otherwise instructed.  3. Common side effects of steroids include facial flushing or redness, restlessness or inability to sleep and an increase in your blood sugar if you are a diabetic.    4. Follow up with the ordering physician for post care.  5. If you have any of the following please call [phone]:      Temperature greater than 101     Pain, redness or swelling at the injection site

## 2017-03-20 ENCOUNTER — Other Ambulatory Visit: Payer: Self-pay | Admitting: Neurosurgery

## 2017-10-26 ENCOUNTER — Other Ambulatory Visit: Payer: Self-pay | Admitting: Neurosurgery

## 2017-10-26 DIAGNOSIS — M461 Sacroiliitis, not elsewhere classified: Secondary | ICD-10-CM

## 2017-12-02 ENCOUNTER — Ambulatory Visit
Admission: RE | Admit: 2017-12-02 | Discharge: 2017-12-02 | Disposition: A | Discharge status: Home / Self Care | Payer: Medicare Other | Source: Ambulatory Visit | Attending: Neurosurgery | Admitting: Neurosurgery

## 2017-12-02 DIAGNOSIS — M461 Sacroiliitis, not elsewhere classified: Secondary | ICD-10-CM

## 2017-12-02 MED ORDER — METHYLPREDNISOLONE ACETATE 40 MG/ML INJ SUSP (RADIOLOG
120.0000 mg | Freq: Once | INTRAMUSCULAR | Status: AC
  Administered 2017-12-02: 120 mg via INTRA_ARTICULAR

## 2017-12-02 MED ORDER — IOPAMIDOL (ISOVUE-M 200) INJECTION 41%
1.0000 mL | Freq: Once | INTRAMUSCULAR | Status: AC
  Administered 2017-12-02: 1 mL via INTRA_ARTICULAR

## 2017-12-02 NOTE — Discharge Instructions (Signed)

## 2019-12-19 ENCOUNTER — Ambulatory Visit: Payer: HRSA Program | Attending: Internal Medicine

## 2019-12-19 DIAGNOSIS — Z20828 Contact with and (suspected) exposure to other viral communicable diseases: Secondary | ICD-10-CM | POA: Insufficient documentation

## 2019-12-19 DIAGNOSIS — Z20822 Contact with and (suspected) exposure to covid-19: Secondary | ICD-10-CM

## 2019-12-20 ENCOUNTER — Telehealth: Payer: Self-pay | Admitting: *Deleted

## 2019-12-20 NOTE — Telephone Encounter (Signed)
Patient called for results ,still pending . 

## 2019-12-21 ENCOUNTER — Telehealth: Payer: Self-pay | Admitting: *Deleted

## 2019-12-21 LAB — NOVEL CORONAVIRUS, NAA: SARS-CoV-2, NAA: NOT DETECTED

## 2019-12-21 NOTE — Telephone Encounter (Signed)
Patient called ,given negative covid results .

## 2022-04-23 ENCOUNTER — Other Ambulatory Visit: Payer: Self-pay | Admitting: Surgery

## 2022-04-23 ENCOUNTER — Other Ambulatory Visit: Payer: Self-pay | Admitting: Emergency Medicine

## 2022-04-25 ENCOUNTER — Other Ambulatory Visit: Payer: Self-pay | Admitting: Surgery

## 2022-05-01 ENCOUNTER — Other Ambulatory Visit: Payer: Self-pay | Admitting: Surgery

## 2022-05-01 DIAGNOSIS — M7581 Other shoulder lesions, right shoulder: Secondary | ICD-10-CM

## 2022-05-07 ENCOUNTER — Ambulatory Visit
Admission: RE | Admit: 2022-05-07 | Discharge: 2022-05-07 | Disposition: A | Discharge status: Home / Self Care | Payer: Commercial Managed Care - HMO | Source: Ambulatory Visit | Attending: Surgery | Admitting: Surgery

## 2022-05-07 DIAGNOSIS — M7581 Other shoulder lesions, right shoulder: Secondary | ICD-10-CM

## 2022-05-07 MED ORDER — IOPAMIDOL (ISOVUE-M 200) INJECTION 41%
18.0000 mL | Freq: Once | INTRAMUSCULAR | Status: AC
  Administered 2022-05-07: 18 mL via INTRA_ARTICULAR

## 2022-11-26 IMAGING — CT CT SHOULDER*R* W/CM
1 of 2 series · 9 of 14 positions shown, 12 images · non-contrast
Comparison: None Available.

CLINICAL DATA: Right shoulder pain for 6 weeks.

EXAM:
CT ARTHROGRAPHY OF THE RIGHT SHOULDER
TECHNIQUE: Multidetector CT imaging was performed following the standard
protocol after injection of dilute contrast into the joint.
RADIATION DOSE REDUCTION: This exam was performed according to the
departmental dose-optimization program which includes automated
exposure control, adjustment of the mA and/or kV according to
patient size and/or use of iterative reconstruction technique.

[Series 6: thin soft · axial · 0.55mm/px · z∈[-213,-20]mm · 9 of 404 slices shown, 12 images]
[im 41/404  soft-tissue]
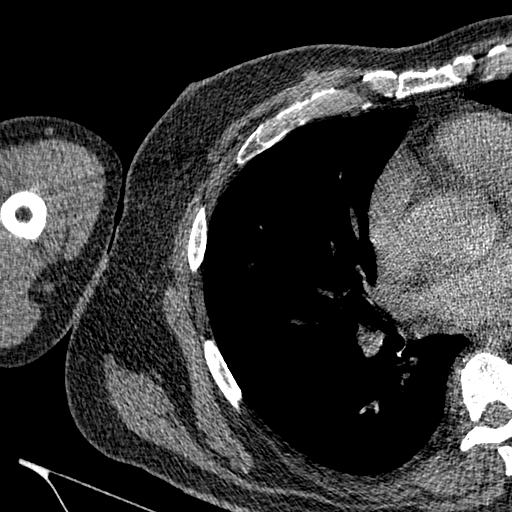
[im 41/404  bone]
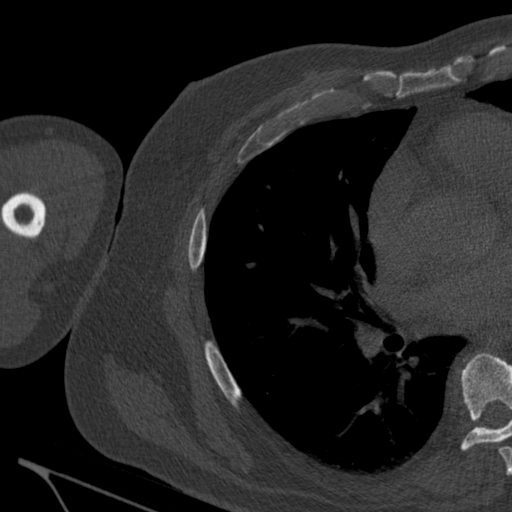
[im 81/404  bone]
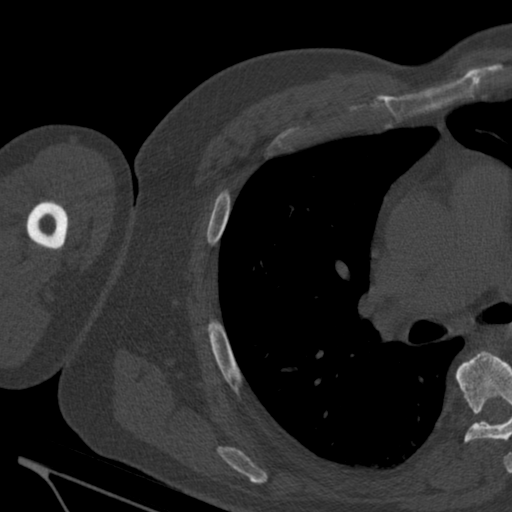
[im 121/404  bone]
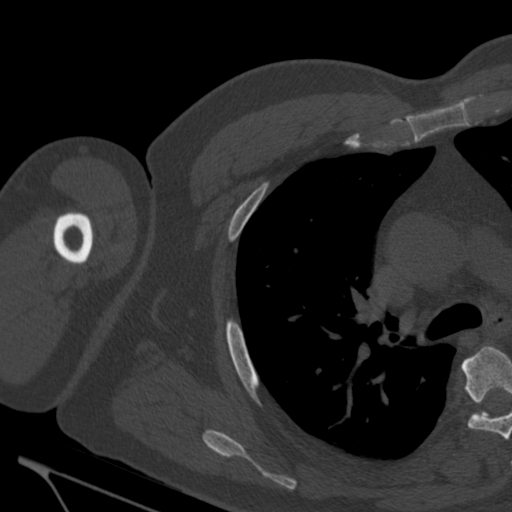
[im 162/404  bone]
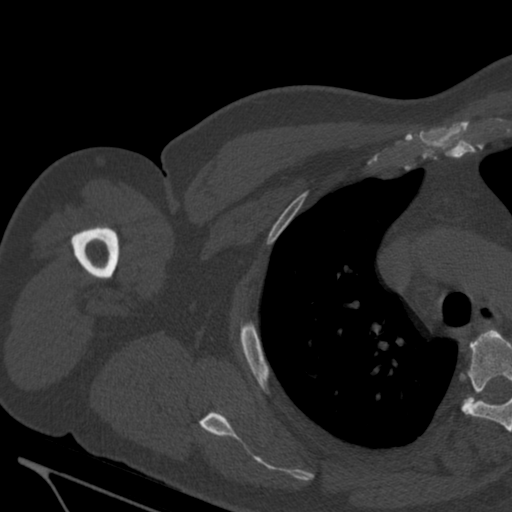
[im 202/404  soft-tissue]
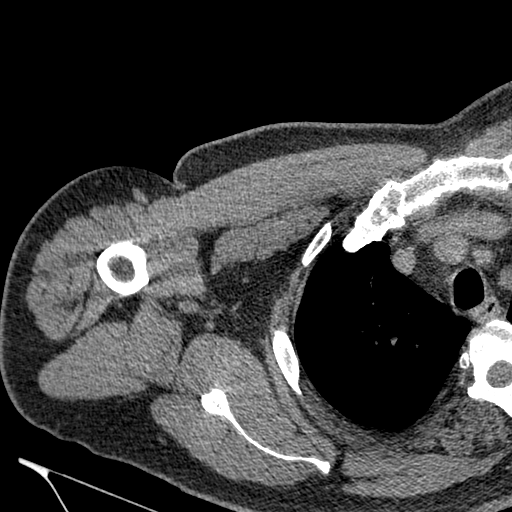
[im 202/404  bone]
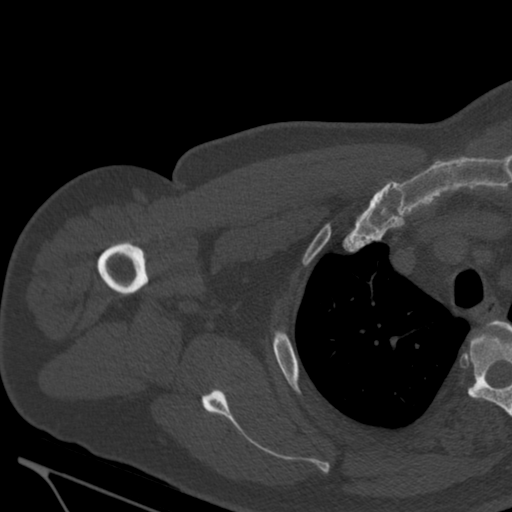
[im 242/404  bone]
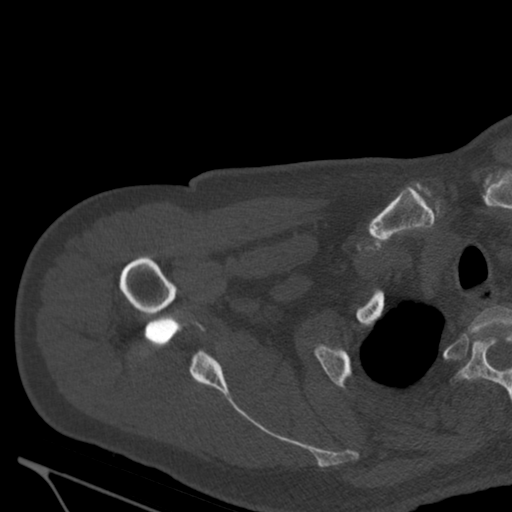
[im 283/404  bone]
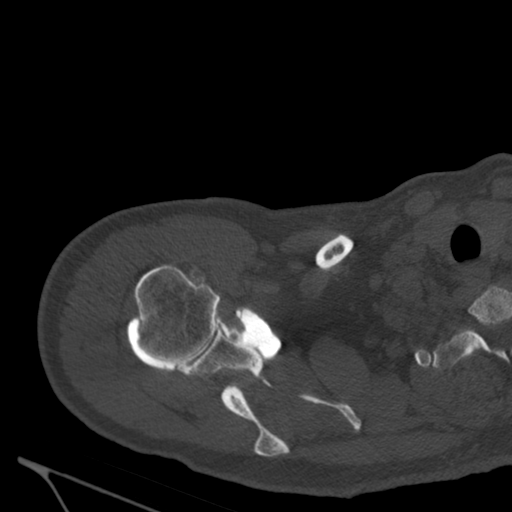
[im 323/404  bone]
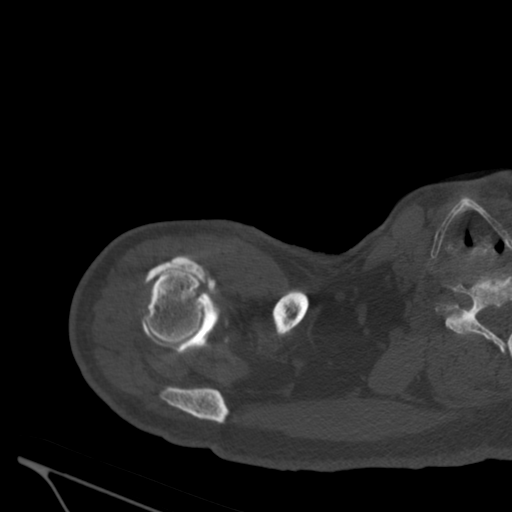
[im 363/404  soft-tissue]
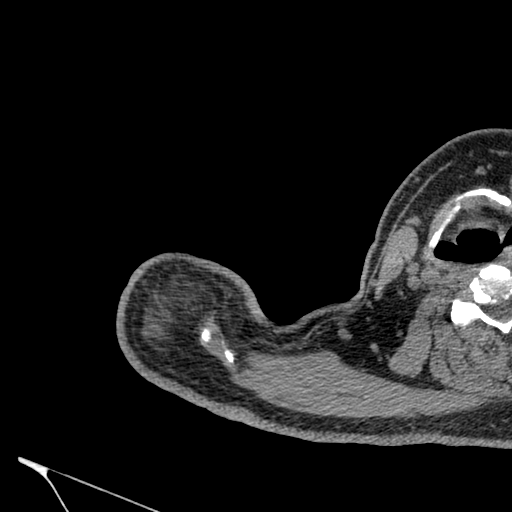
[im 363/404  bone]
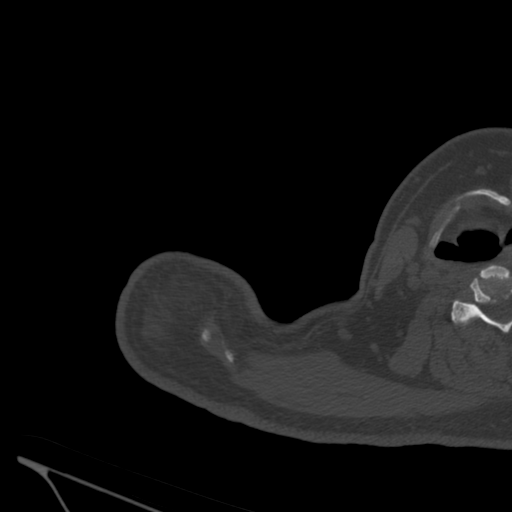

[9 of 14 positions shown; findings below may reference images not displayed]

FINDINGS: Rotator cuff: High-grade partial-thickness articular surface tear of
the supraspinatus tendon with a full-thickness component anteriorly.
Infraspinatus tendon is intact. Teres minor tendon is intact.
Subscapularis tendon is intact.

Muscles: No muscle atrophy. No intramuscular fluid collection or
hematoma.

Biceps Long Head: Mild tendinosis of the intra-articular portion of
the long head of the biceps tendon with a small partial-thickness
tear.

Acromioclavicular Joint: Mild arthropathy of the acromioclavicular
joint. Small amount of subacromial/subdeltoid bursal fluid.

Glenohumeral Joint: Intraarticular contrast distending the joint
capsule. Normal glenohumeral ligaments. Partial-thickness cartilage
loss of the glenohumeral joint.

Labrum: Intact.

Bones: No fracture or dislocation. No aggressive osseous lesion.

Other: Thoracic aortic atherosclerosis. Visualized right lung is
clear.
IMPRESSION: 1. High-grade partial-thickness articular surface tear of the
supraspinatus tendon with a full-thickness component anteriorly.
2. Mild tendinosis of the intra-articular portion of the long head
of the biceps tendon with a small partial-thickness tear.
3. Partial-thickness cartilage loss of the glenohumeral joint.
4. Aortic Atherosclerosis ([id number].T).

## 2022-11-26 IMAGING — XA DG FLUORO GUIDE NDL PLC/BX
1 series · 1 of 1 positions shown · non-contrast
Comparison: none

CLINICAL DATA: Right rotator cuff tendonitis.  Previous surgery.

[Series 1: ortho standard · 1 of 1 slices shown]
[im 1/1]
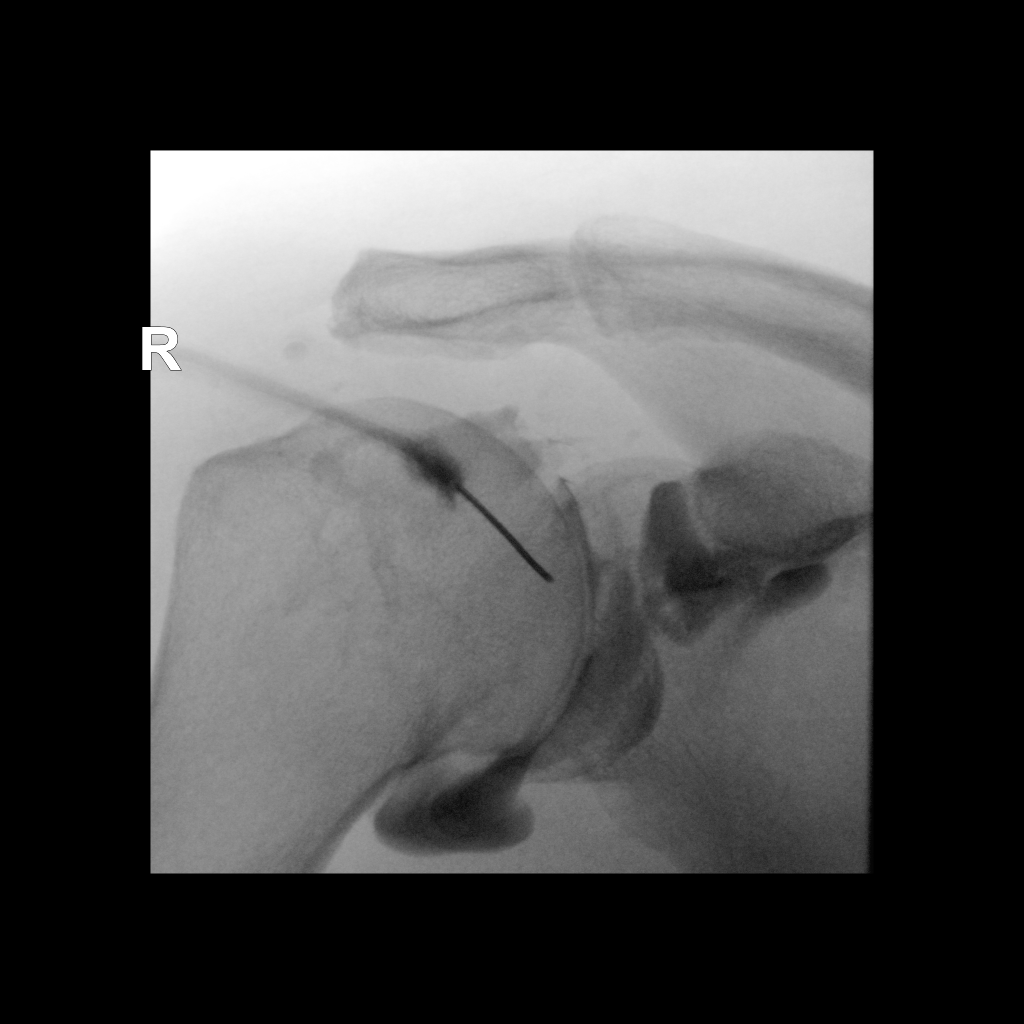

[1 of 1 positions shown; findings below may reference images not displayed]

FLUOROSCOPY:
Radiation Exposure Index (as provided by the fluoroscopic device):
Dose area product 10.7 uGy*m2

PROCEDURE:
Right SHOULDER INJECTION UNDER FLUOROSCOPY

An appropriate skin entrance site was determined. The site was
marked, prepped with Betadine, draped in the usual sterile fashion,
and infiltrated locally with buffered Lidocaine. 22 gauge spinal
needle was advanced to the superomedial margin of the humeral head
under intermittent fluoroscopy. 1 ml of Lidocaine injected easily.
20 ml of dilute Isovue M 200 was then used to opacify the right
shoulder capsule. No immediate complication.
IMPRESSION: Technically successful right shoulder injection for CT.

## 2024-12-19 ENCOUNTER — Encounter: Payer: Self-pay | Admitting: Gastroenterology

## 2024-12-19 ENCOUNTER — Encounter
Admission: RE | Disposition: A | Discharge status: Home / Self Care | Payer: Self-pay | Source: Home / Self Care | Attending: Gastroenterology

## 2024-12-19 ENCOUNTER — Ambulatory Visit
Admission: RE | Admit: 2024-12-19 | Discharge: 2024-12-19 | Disposition: A | Discharge status: Home / Self Care | Attending: Gastroenterology | Admitting: Gastroenterology

## 2024-12-19 ENCOUNTER — Other Ambulatory Visit: Payer: Self-pay

## 2024-12-19 ENCOUNTER — Ambulatory Visit: Admitting: Anesthesiology

## 2024-12-19 DIAGNOSIS — D124 Benign neoplasm of descending colon: Secondary | ICD-10-CM | POA: Insufficient documentation

## 2024-12-19 DIAGNOSIS — Z6834 Body mass index (BMI) 34.0-34.9, adult: Secondary | ICD-10-CM | POA: Insufficient documentation

## 2024-12-19 DIAGNOSIS — I10 Essential (primary) hypertension: Secondary | ICD-10-CM | POA: Diagnosis not present

## 2024-12-19 DIAGNOSIS — K644 Residual hemorrhoidal skin tags: Secondary | ICD-10-CM | POA: Insufficient documentation

## 2024-12-19 DIAGNOSIS — Z79899 Other long term (current) drug therapy: Secondary | ICD-10-CM | POA: Insufficient documentation

## 2024-12-19 DIAGNOSIS — E119 Type 2 diabetes mellitus without complications: Secondary | ICD-10-CM | POA: Insufficient documentation

## 2024-12-19 DIAGNOSIS — Z1211 Encounter for screening for malignant neoplasm of colon: Secondary | ICD-10-CM | POA: Insufficient documentation

## 2024-12-19 DIAGNOSIS — K5730 Diverticulosis of large intestine without perforation or abscess without bleeding: Secondary | ICD-10-CM | POA: Insufficient documentation

## 2024-12-19 DIAGNOSIS — E669 Obesity, unspecified: Secondary | ICD-10-CM | POA: Insufficient documentation

## 2024-12-19 DIAGNOSIS — Z7984 Long term (current) use of oral hypoglycemic drugs: Secondary | ICD-10-CM | POA: Diagnosis not present

## 2024-12-19 DIAGNOSIS — Z7985 Long-term (current) use of injectable non-insulin antidiabetic drugs: Secondary | ICD-10-CM | POA: Diagnosis not present

## 2024-12-19 HISTORY — PX: POLYPECTOMY: SHX149

## 2024-12-19 HISTORY — PX: COLONOSCOPY: SHX5424

## 2024-12-19 LAB — GLUCOSE, CAPILLARY: Glucose-Capillary: 82 mg/dL (ref 70–99)

## 2024-12-19 SURGERY — COLONOSCOPY
Anesthesia: General

## 2024-12-19 MED ORDER — PROPOFOL 10 MG/ML IV BOLUS
INTRAVENOUS | Status: DC | PRN
  Administered 2024-12-19: 100 mg via INTRAVENOUS

## 2024-12-19 MED ORDER — PROPOFOL 500 MG/50ML IV EMUL
INTRAVENOUS | Status: DC | PRN
  Administered 2024-12-19: 150 ug/kg/min via INTRAVENOUS

## 2024-12-19 MED ORDER — EPHEDRINE 5 MG/ML INJ
INTRAVENOUS | Status: AC
  Filled 2024-12-19: qty 5

## 2024-12-19 MED ORDER — PROPOFOL 1000 MG/100ML IV EMUL
INTRAVENOUS | Status: AC
  Filled 2024-12-19: qty 100

## 2024-12-19 MED ORDER — SODIUM CHLORIDE 0.9 % IV SOLN: INTRAVENOUS | Status: DC

## 2024-12-19 MED ORDER — EPHEDRINE SULFATE-NACL 50-0.9 MG/10ML-% IV SOSY
PREFILLED_SYRINGE | INTRAVENOUS | Status: DC | PRN
  Administered 2024-12-19: 10 mg via INTRAVENOUS
  Administered 2024-12-19: 5 mg via INTRAVENOUS

## 2024-12-19 NOTE — Anesthesia Preprocedure Evaluation (Addendum)
"                                    Anesthesia Evaluation  Patient identified by MRN, date of birth, ID band Patient awake    Reviewed: Allergy & Precautions, H&P , NPO status , Patient's Chart, lab work & pertinent test results  Airway Mallampati: II  TM Distance: >3 FB Neck ROM: full    Dental no notable dental hx.    Pulmonary neg pulmonary ROS   Pulmonary exam normal        Cardiovascular hypertension, Normal cardiovascular exam     Neuro/Psych negative neurological ROS  negative psych ROS   GI/Hepatic negative GI ROS, Neg liver ROS,,,  Endo/Other  diabetes    Renal/GU negative Renal ROS  negative genitourinary   Musculoskeletal   Abdominal  (+) + obese  Peds  Hematology negative hematology ROS (+)   Anesthesia Other Findings Past Medical History: 1991: Cancer (HCC)     Comment:  skin cancer 2005: Diabetes mellitus     Comment:  type 2 1997-1998: Hypertension     Comment:  oral meds  Past Surgical History: No date: ANKLE SURGERY     Comment:  repair fx, pt was 63years old jan 1997-2007: BACK SURGERY     Comment:  has had 3 surgeries 2 years ago: KNEE ARTHROSCOPY     Comment:  repaired meniscus 2010: ROTATOR CUFF REPAIR     Comment:  right shoulder     Reproductive/Obstetrics negative OB ROS                              Anesthesia Physical Anesthesia Plan  ASA: 2  Anesthesia Plan: General   Post-op Pain Management:    Induction:   PONV Risk Score and Plan: Propofol  infusion and TIVA  Airway Management Planned:   Additional Equipment:   Intra-op Plan:   Post-operative Plan:   Informed Consent: I have reviewed the patients History and Physical, chart, labs and discussed the procedure including the risks, benefits and alternatives for the proposed anesthesia with the patient or authorized representative who has indicated his/her understanding and acceptance.     Dental Advisory Given  Plan  Discussed with: CRNA and Surgeon  Anesthesia Plan Comments:          Anesthesia Quick Evaluation  "

## 2024-12-19 NOTE — Transfer of Care (Signed)
 Immediate Anesthesia Transfer of Care Note  Patient: Maurice Dennis.  Procedure(s) Performed: COLONOSCOPY POLYPECTOMY, INTESTINE  Patient Location: PACU  Anesthesia Type:General  Level of Consciousness: awake and sedated  Airway & Oxygen Therapy: Patient Spontanous Breathing and Patient connected to face mask oxygen  Post-op Assessment: Report given to RN and Post -op Vital signs reviewed and stable  Post vital signs: Reviewed and stable  Last Vitals:  Vitals Value Taken Time  BP    Temp    Pulse    Resp    SpO2      Last Pain:  Vitals:   12/19/24 1000  TempSrc: Temporal  PainSc: 0-No pain         Complications: No notable events documented.

## 2024-12-19 NOTE — H&P (Signed)
 "  Pre-Procedure H&P   Patient ID: Maurice Dennis. is a 63 y.o. male.  Gastroenterology Provider: [name], DO  Referring Provider: Dr. Jeffie PCP: Jeffie Cheryl BRAVO, MD  Date: 12/19/2024  HPI Mr. Maurice Dennis. is a 64 y.o. male who presents today for Colonoscopy for Colorectal cancer screening .  Last underwent colonoscopy in 10/2013-diverticulosis and external hemorrhoids at that time  No family history of colon cancer or colon polyps  GLP-1 agonist has been held for the procedure (last dose 12/11/24)   Past Medical History:  Diagnosis Date   Cancer (HCC) 1991   skin cancer   Diabetes mellitus 2005   type 2   Hypertension 1997-1998   oral meds    Past Surgical History:  Procedure Laterality Date   ANKLE SURGERY     repair fx, pt was 63years old   back neural stimulator     2005   BACK SURGERY  [place] 1997-2007   has had 3 surgeries   COLONOSCOPY     KNEE ARTHROSCOPY  2 years ago   repaired meniscus   parathyroid adenoma     2024   ROTATOR CUFF REPAIR  12/22/2008   right shoulder    Family History No h/o GI disease or malignancy  Review of Systems  Constitutional:  Negative for activity change, appetite change, chills, diaphoresis, fatigue, fever and unexpected weight change.  HENT:  Negative for trouble swallowing and voice change.   Respiratory:  Negative for shortness of breath and wheezing.   Cardiovascular:  Negative for chest pain, palpitations and leg swelling.  Gastrointestinal:  Negative for abdominal distention, abdominal pain, anal bleeding, blood in stool, constipation, diarrhea, nausea and vomiting.  Skin:  Negative for color change and pallor.  Neurological:  Negative for dizziness, syncope and weakness.  Psychiatric/Behavioral:  Negative for confusion. The patient is not nervous/anxious.   All other systems reviewed and are negative.    Medications Medications Ordered Prior to Encounter[1]  Pertinent  medications related to GI and procedure were reviewed by me with the patient prior to the procedure  Current Medications[2]  sodium chloride  20 mL/hr at 12/19/24 0959       Allergies[3] Allergies were reviewed by me prior to the procedure  Objective   Body mass index is 34.05 kg/m. Vitals:   12/19/24 1000  BP: (!) 154/78  Pulse: 64  Resp: 18  Temp: 97.8 F (36.6 C)  TempSrc: Temporal  SpO2: 100%  Weight: 104.6 kg  Height: 5' 9 (1.753 m)     Physical Exam Vitals and nursing note reviewed.  Constitutional:      General: He is not in acute distress.    Appearance: Normal appearance. He is not ill-appearing, toxic-appearing or diaphoretic.  HENT:     Head: Normocephalic and atraumatic.     Nose: Nose normal.     Mouth/Throat:     Mouth: Mucous membranes are moist.     Pharynx: Oropharynx is clear.  Eyes:     General: No scleral icterus.    Extraocular Movements: Extraocular movements intact.  Cardiovascular:     Rate and Rhythm: Normal rate and regular rhythm.     Heart sounds: Normal heart sounds. No murmur heard.    No friction rub. No gallop.  Pulmonary:     Effort: Pulmonary effort is normal. No respiratory distress.     Breath sounds: Normal breath sounds. No wheezing, rhonchi or rales.  Abdominal:     General:  Bowel sounds are normal. There is no distension.     Palpations: Abdomen is soft.     Tenderness: There is no abdominal tenderness. There is no guarding or rebound.  Musculoskeletal:     Cervical back: Neck supple.     Right lower leg: No edema.     Left lower leg: No edema.  Skin:    General: Skin is warm and dry.     Coloration: Skin is not jaundiced or pale.  Neurological:     General: No focal deficit present.     Mental Status: He is alert and oriented to person, place, and time. Mental status is at baseline.  Psychiatric:        Mood and Affect: Mood normal.        Behavior: Behavior normal.        Thought Content: Thought content  normal.        Judgment: Judgment normal.      Assessment:  Mr. Maurice Dennis. is a 63 y.o. male  who presents today for Colonoscopy for Colorectal cancer screening .  Plan:  Colonoscopy with possible intervention today  Colonoscopy with possible biopsy, control of bleeding, polypectomy, and interventions as necessary has been discussed with the patient/patient representative. Informed consent was obtained from the patient/patient representative after explaining the indication, nature, and risks of the procedure including but not limited to death, bleeding, perforation, missed neoplasm/lesions, cardiorespiratory compromise, and reaction to medications. Opportunity for questions was given and appropriate answers were provided. Patient/patient representative has verbalized understanding is amenable to undergoing the procedure.   [name], DO  [facility]  Portions of the record may have been created with voice recognition software. Occasional wrong-word or 'sound-a-like' substitutions may have occurred due to the inherent limitations of voice recognition software.  Read the chart carefully and recognize, using context, where substitutions may have occurred.     [1]  No current facility-administered medications on file prior to encounter.   Current Outpatient Medications on File Prior to Encounter  Medication Sig Dispense Refill   amLODipine (NORVASC) 5 MG tablet Take by mouth.     atenolol (TENORMIN) 100 MG tablet Take by mouth.     chlorthalidone (HYGROTON) 25 MG tablet Take by mouth.     Dulaglutide (TRULICITY) 3 MG/0.5ML SOAJ Inject into the skin once a week. On sunday     glipiZIDE (GLUCOTROL XL) 5 MG 24 hr tablet TAKE 1 TABLET(5 MG) BY MOUTH EVERY DAY     lisinopril (PRINIVIL,ZESTRIL) 40 MG tablet Take by mouth.     metFORMIN (GLUCOPHAGE) 500 MG tablet TAKE 2 TABLETS(1000 MG) BY MOUTH TWICE DAILY WITH MEALS     pravastatin (PRAVACHOL) 80 MG  tablet Take 80 mg by mouth daily.     diclofenac sodium (VOLTAREN) 1 % GEL      glucose blood (ONE TOUCH ULTRA TEST) test strip TEST THREE TIMES DAILY AS DIRECTED     HYDROcodone-acetaminophen  (NORCO) 7.5-325 MG tablet TK 1 TO 2 TS PO Q 8 H PRN FOR PAIN     morphine (MS CONTIN) 60 MG 12 hr tablet Take by mouth.     pravastatin (PRAVACHOL) 80 MG tablet Take by mouth.    [2]  Current Facility-Administered Medications:    0.9 %  sodium chloride  infusion, , Intravenous, Continuous, Jungling Elspeth Ozell, DO, Last Rate: 20 mL/hr at 12/19/24 0959, New Bag at 12/19/24 0959 [3] No Known Allergies  "

## 2024-12-19 NOTE — Interval H&P Note (Signed)
 History and Physical Interval Note: Preprocedure H&P from 12/19/2024  was reviewed and there was no interval change after seeing and examining the patient.  Written consent was obtained from the patient after discussion of risks, benefits, and alternatives. Patient has consented to proceed with Colonoscopy with possible intervention   12/19/2024 10:58 AM  Maurice Dennis.  has presented today for surgery, with the diagnosis of Screening for colon cancer (Z12.11).  The various methods of treatment have been discussed with the patient and family. After consideration of risks, benefits and other options for treatment, the patient has consented to  Procedures: COLONOSCOPY (N/A) as a surgical intervention.  The patient's history has been reviewed, patient examined, no change in status, stable for surgery.  I have reviewed the patient's chart and labs.  Questions were answered to the patient's satisfaction.     [name]

## 2024-12-19 NOTE — Anesthesia Postprocedure Evaluation (Signed)
"   Anesthesia Post Note  Patient: Maurice Dennis.  Procedure(s) Performed: COLONOSCOPY POLYPECTOMY, INTESTINE  Patient location during evaluation: Endoscopy Anesthesia Type: General Level of consciousness: awake and alert Pain management: pain level controlled Vital Signs Assessment: post-procedure vital signs reviewed and stable Respiratory status: spontaneous breathing, nonlabored ventilation and respiratory function stable Cardiovascular status: blood pressure returned to baseline and stable Postop Assessment: no apparent nausea or vomiting Anesthetic complications: no   No notable events documented.   Last Vitals:  Vitals:   12/19/24 1133 12/19/24 1145  BP: (!) 100/59 107/75  Pulse: 68 62  Resp: (!) 22 (!) 24  Temp:    SpO2: 100% 100%    Last Pain:  Vitals:   12/19/24 1145  TempSrc:   PainSc: 0-No pain                 Camellia Merilee Louder      "

## 2024-12-19 NOTE — Op Note (Signed)
 [facility] Gastroenterology Patient Name: Maurice Dennis Procedure Date: 12/19/2024 11:00 AM MRN: [id number] Account #: [id number] Date of Birth: 04-07-1961 Admit Type: Outpatient Age: 63 Room: [facility] ENDO ROOM 2 Gender: Male Note Status: Finalized Instrument Name: Colon Scope [phone] [id number] Procedure:             Colonoscopy Indications:           Screening for colorectal malignant neoplasm Providers:             [name] DO, DO Referring MD:          [name] (Referring MD) Medicines:             Monitored Anesthesia Care Complications:         No immediate complications. Estimated blood loss:                         Minimal. Procedure:             Pre-Anesthesia Assessment:                        - Prior to the procedure, a History and Physical was                         performed, and patient medications and allergies were                         reviewed. The patient is competent. The risks and                         benefits of the procedure and the sedation options and                         risks were discussed with the patient. All questions                         were answered and informed consent was obtained.                         Patient identification and proposed procedure were                         verified by the physician, the nurse, the anesthetist                         and the technician in the endoscopy suite. Mental                         Status Examination: alert and oriented. Airway                         Examination: normal oropharyngeal airway and neck                         mobility. Respiratory Examination: clear to                         auscultation. CV Examination: RRR, no murmurs, no S3  or S4. Prophylactic Antibiotics: The patient does not                         require prophylactic antibiotics. Prior                         Anticoagulants: The patient has taken no anticoagulant                          or antiplatelet agents. ASA Grade Assessment: II - A                         patient with mild systemic disease. After reviewing                         the risks and benefits, the patient was deemed in                         satisfactory condition to undergo the procedure. The                         anesthesia plan was to use monitored anesthesia care                         (MAC). Immediately prior to administration of                         medications, the patient was re-assessed for adequacy                         to receive sedatives. The heart rate, respiratory                         rate, oxygen saturations, blood pressure, adequacy of                         pulmonary ventilation, and response to care were                         monitored throughout the procedure. The physical                         status of the patient was re-assessed after the                         procedure.                        After obtaining informed consent, the colonoscope was                         passed under direct vision. Throughout the procedure,                         the patient's blood pressure, pulse, and oxygen                         saturations were monitored continuously. The  Colonoscope was introduced through the anus and                         advanced to the the terminal ileum, with                         identification of the appendiceal orifice and IC                         valve. The colonoscopy was performed without                         difficulty. The patient tolerated the procedure well.                         The quality of the bowel preparation was evaluated                         using the BBPS [facility] Bowel Preparation Scale) with                         scores of: Right Colon = 3, Transverse Colon = 3 and                         Left Colon = 3 (entire mucosa seen well with no                         residual  staining, small fragments of stool or opaque                         liquid). The total BBPS score equals 9. The terminal                         ileum, ileocecal valve, appendiceal orifice, and                         rectum were photographed. Findings:      The perianal and digital rectal examinations were normal. Pertinent       negatives include normal sphincter tone.      The terminal ileum appeared normal. Estimated blood loss: none.      Retroflexion in the right colon was performed.      A 1 to 2 mm polyp was found in the descending colon. The polyp was       sessile. The polyp was removed with a jumbo cold forceps. Resection and       retrieval were complete. Estimated blood loss was minimal.      Scattered small-mouthed diverticula were found in the entire colon.       Estimated blood loss: none.      The exam was otherwise without abnormality on direct and retroflexion       views. Impression:            - The examined portion of the ileum was normal.                        - One 1 to 2 mm polyp in the descending colon, removed  with a jumbo cold forceps. Resected and retrieved.                        - Diverticulosis in the entire examined colon.                        - The examination was otherwise normal on direct and                         retroflexion views. Recommendation:        - Patient has a contact number available for                         emergencies. The signs and symptoms of potential                         delayed complications were discussed with the patient.                         Return to normal activities tomorrow. Written                         discharge instructions were provided to the patient.                        - Discharge patient to home.                        - Resume previous diet.                        - Continue present medications.                        - Await pathology results.                        -  Repeat colonoscopy for surveillance based on                         pathology results.                        - Return to referring physician as previously                         scheduled.                        - The findings and recommendations were discussed with                         the patient. Procedure Code(s):     --- Professional ---                        [phone], Colonoscopy, flexible; with biopsy, single or                         multiple Diagnosis Code(s):     --- Professional ---  Z12.11, Encounter for screening for malignant neoplasm                         of colon                        D12.4, Benign neoplasm of descending colon                        K57.30, Diverticulosis of large intestine without                         perforation or abscess without bleeding CPT copyright 2022 [employer]. All rights reserved. The codes documented in this report are preliminary and upon coder review may  be revised to meet current compliance requirements. Attending Participation:      I personally performed the entire procedure. Elspeth Jungling, DO [name] DO, DO 12/19/2024 11:25:32 AM This report has been signed electronically. Number of Addenda: 0 Note Initiated On: 12/19/2024 11:00 AM Scope Withdrawal Time: 0 hours 9 minutes 28 seconds  Total Procedure Duration: 0 hours 13 minutes 40 seconds  Estimated Blood Loss:  Estimated blood loss was minimal.      [facility]

## 2024-12-20 ENCOUNTER — Encounter: Payer: Self-pay | Admitting: Gastroenterology

## 2024-12-20 LAB — SURGICAL PATHOLOGY
